# Patient Record
Sex: Female | Born: 1990 | Race: Black or African American | Hispanic: No | Marital: Single | Smoking: Never smoker
Health system: Southern US, Community
[De-identification: ages and names within clinical notes are randomized; demographics above are authoritative.]

## PROBLEM LIST (undated history)

## (undated) DIAGNOSIS — E669 Obesity, unspecified: Secondary | ICD-10-CM

---
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
## 2012-12-16 ENCOUNTER — Encounter (HOSPITAL_BASED_OUTPATIENT_CLINIC_OR_DEPARTMENT_OTHER): Payer: Self-pay | Admitting: Emergency Medicine

## 2012-12-16 ENCOUNTER — Emergency Department (HOSPITAL_BASED_OUTPATIENT_CLINIC_OR_DEPARTMENT_OTHER)
Admission: EM | Admit: 2012-12-16 | Discharge: 2012-12-17 | Disposition: A | Discharge status: Home / Self Care | Payer: Medicaid Other | Attending: Emergency Medicine | Admitting: Emergency Medicine

## 2012-12-16 DIAGNOSIS — M25572 Pain in left ankle and joints of left foot: Secondary | ICD-10-CM

## 2012-12-16 DIAGNOSIS — M25571 Pain in right ankle and joints of right foot: Secondary | ICD-10-CM

## 2012-12-16 DIAGNOSIS — M25579 Pain in unspecified ankle and joints of unspecified foot: Secondary | ICD-10-CM | POA: Insufficient documentation

## 2012-12-16 DIAGNOSIS — E669 Obesity, unspecified: Secondary | ICD-10-CM | POA: Insufficient documentation

## 2012-12-16 HISTORY — DX: Obesity, unspecified: E66.9

## 2012-12-16 NOTE — ED Notes (Signed)
Pt c/o ankle pain while walking at work last week. No specific injury

## 2012-12-17 MED ORDER — NAPROXEN 250 MG PO TABS
500.0000 mg | ORAL_TABLET | Freq: Once | ORAL | Status: AC
  Administered 2012-12-17: 500 mg via ORAL
  Filled 2012-12-17: qty 2

## 2012-12-17 MED ORDER — NAPROXEN SODIUM 550 MG PO TABS: ORAL_TABLET | ORAL | Status: DC

## 2012-12-17 NOTE — ED Provider Notes (Signed)
History     CSN: [id number]  Arrival date & time 12/16/12  2031   First MD Initiated Contact with Patient 12/17/12 0035      Chief Complaint  Patient presents with  . Ankle Pain    (Consider location/radiation/quality/duration/timing/severity/associated sxs/prior treatment) HPI This is a 22 year old female who is on her feet all day at work. She's had about a one-week history of pain in her ankles bilaterally. The pain is specifically located in the posterior and size of the ankles. She has been taking acetaminophen without relief. She denies trauma. There is no associated deformity or swelling. She denies systemic symptoms such as fever or chills. The pain is moderate and worse with palpation or movement  Past Medical History  Diagnosis Date  . Obesity     History reviewed. No pertinent past surgical history.  No family history on file.  History  Substance Use Topics  . Smoking status: Never Smoker   . Smokeless tobacco: Not on file  . Alcohol Use: No    OB History   Grav Para Term Preterm Abortions TAB SAB Ect Mult Living                  Review of Systems  All other systems reviewed and are negative.    Allergies  Review of patient's allergies indicates no known allergies.  Home Medications   Current Outpatient Rx  Name  Route  Sig  Dispense  Refill  . acetaminophen (TYLENOL) 325 MG tablet   Oral   Take 650 mg by mouth every 6 (six) hours as needed for pain.           BP 166/81  Pulse 76  Temp(Src) 98.2 F (36.8 C) (Oral)  Resp 18  Ht 5\' 9"  (1.753 m)  Wt 311 lb (141.069 kg)  BMI 45.91 kg/m2  SpO2 100%  Physical Exam General: Well-developed, well-nourished female in no acute distress; appearance consistent with age of record HENT: normocephalic, atraumatic Eyes: pupils equal round and reactive to light; extraocular muscles intact Neck: supple Heart: regular rate and rhythm Lungs: clear to auscultation bilaterally Abdomen: soft;  nondistended; nontender; bowel sounds present Extremities: No deformity; full range of motion; pulses normal; tenderness of soft tissue of ankles bilaterally without deformity, swelling or Achilles tendon defect palpated Neurologic: Awake, alert and oriented; motor function intact in all extremities and symmetric; no facial droop Skin: Warm and dry Psychiatric: Normal mood and affect    ED Course  Procedures (including critical care time)     MDM          Hanley Seamen, MD 12/17/12 0045

## 2014-08-09 ENCOUNTER — Emergency Department (HOSPITAL_BASED_OUTPATIENT_CLINIC_OR_DEPARTMENT_OTHER)
Admission: EM | Admit: 2014-08-09 | Discharge: 2014-08-09 | Disposition: A | Discharge status: Home / Self Care | Payer: PRIVATE HEALTH INSURANCE | Attending: Emergency Medicine | Admitting: Emergency Medicine

## 2014-08-09 ENCOUNTER — Encounter (HOSPITAL_BASED_OUTPATIENT_CLINIC_OR_DEPARTMENT_OTHER): Payer: Self-pay

## 2014-08-09 ENCOUNTER — Emergency Department (HOSPITAL_BASED_OUTPATIENT_CLINIC_OR_DEPARTMENT_OTHER): Payer: PRIVATE HEALTH INSURANCE

## 2014-08-09 DIAGNOSIS — E669 Obesity, unspecified: Secondary | ICD-10-CM | POA: Diagnosis not present

## 2014-08-09 DIAGNOSIS — R079 Chest pain, unspecified: Secondary | ICD-10-CM | POA: Diagnosis not present

## 2014-08-09 DIAGNOSIS — H7292 Unspecified perforation of tympanic membrane, left ear: Secondary | ICD-10-CM

## 2014-08-09 DIAGNOSIS — Z7952 Long term (current) use of systemic steroids: Secondary | ICD-10-CM | POA: Diagnosis not present

## 2014-08-09 DIAGNOSIS — R05 Cough: Secondary | ICD-10-CM | POA: Insufficient documentation

## 2014-08-09 DIAGNOSIS — H7202 Central perforation of tympanic membrane, left ear: Secondary | ICD-10-CM | POA: Diagnosis not present

## 2014-08-09 DIAGNOSIS — Z791 Long term (current) use of non-steroidal anti-inflammatories (NSAID): Secondary | ICD-10-CM | POA: Insufficient documentation

## 2014-08-09 DIAGNOSIS — R112 Nausea with vomiting, unspecified: Secondary | ICD-10-CM | POA: Diagnosis not present

## 2014-08-09 DIAGNOSIS — J029 Acute pharyngitis, unspecified: Secondary | ICD-10-CM | POA: Insufficient documentation

## 2014-08-09 DIAGNOSIS — Z79899 Other long term (current) drug therapy: Secondary | ICD-10-CM | POA: Insufficient documentation

## 2014-08-09 DIAGNOSIS — R059 Cough, unspecified: Secondary | ICD-10-CM

## 2014-08-09 DIAGNOSIS — R197 Diarrhea, unspecified: Secondary | ICD-10-CM | POA: Diagnosis not present

## 2014-08-09 MED ORDER — ONDANSETRON 4 MG PO TBDP: ORAL_TABLET | ORAL | Status: DC

## 2014-08-09 MED ORDER — DEXAMETHASONE 4 MG PO TABS: 12.0000 mg | ORAL_TABLET | Freq: Once | ORAL | Status: DC

## 2014-08-09 MED ORDER — AMOXICILLIN-POT CLAVULANATE 875-125 MG PO TABS: 1.0000 | ORAL_TABLET | Freq: Two times a day (BID) | ORAL | Status: DC

## 2014-08-09 NOTE — Discharge Instructions (Signed)
Eardrum Perforation The eardrum is a thin, round tissue inside the ear that separates the ear canal from the middle ear. This is the tissue that detects sound and enables you to hear. The eardrum can be punctured or torn (perforated). Eardrums generally heal without help and with little or no permanent hearing loss. CAUSES   Sudden pressure changes that happen in situations like scuba diving or flying in an airplane.  Foreign objects in the ear.  Inserting a cotton-tipped swab in the ear.  Loud noise.  Trauma to the ear. SYMPTOMS   Hearing loss.  Ear pain.  Ringing in the ears.  Discharge or bleeding from the ear.  Dizziness.  Vomiting.  Facial paralysis. HOME CARE INSTRUCTIONS   Keep your ear dry, as this improves healing. Swimming, diving, and showers are not allowed until healing is complete. While bathing, protect the ear by placing a piece of cotton covered with petroleum jelly in the outer ear canal.  Only take over-the-counter or prescription medicines for pain, discomfort, or fever as directed by your caregiver.  Blow your nose gently. Forceful blowing increases the pressure in the middle ear and may cause further injury or delay healing.  Resume normal activities, such as showering, when the perforation has healed. Your caregiver can let you know when this has occurred.  Talk to your caregiver before flying on an airplane. Air travel is generally allowed with a perforated eardrum.  If your caregiver has given you a follow-up appointment, it is very important to keep that appointment. Failure to keep the appointment could result in a chronic or permanent injury, pain, hearing loss, and disability. SEEK IMMEDIATE MEDICAL CARE IF:   You have bleeding or pus coming from your ear.  You have problems with balance, dizziness, nausea, or vomiting.  You develop increased pain.  You have a fever. MAKE SURE YOU:   Understand these instructions.  Will watch your  condition.  Will get help right away if you are not doing well or get worse. Document Released: 08/23/2000 Document Revised: 11/18/2011 Document Reviewed: 08/25/2008 [facility] Patient Information 2015 [place], [place]. This information is not intended to replace advice given to you by your health care provider. Make sure you discuss any questions you have with your health care provider.  Cough, Adult  A cough is a reflex that helps clear your throat and airways. It can help heal the body or may be a reaction to an irritated airway. A cough may only last 2 or 3 weeks (acute) or may last more than 8 weeks (chronic).  CAUSES Acute cough:  Viral or bacterial infections. Chronic cough:  Infections.  Allergies.  Asthma.  Post-nasal drip.  Smoking.  Heartburn or acid reflux.  Some medicines.  Chronic lung problems (COPD).  Cancer. SYMPTOMS   Cough.  Fever.  Chest pain.  Increased breathing rate.  High-pitched whistling sound when breathing (wheezing).  Colored mucus that you cough up (sputum). TREATMENT   A bacterial cough may be treated with antibiotic medicine.  A viral cough must run its course and will not respond to antibiotics.  Your caregiver may recommend other treatments if you have a chronic cough. HOME CARE INSTRUCTIONS   Only take over-the-counter or prescription medicines for pain, discomfort, or fever as directed by your caregiver. Use cough suppressants only as directed by your caregiver.  Use a cold steam vaporizer or humidifier in your bedroom or home to help loosen secretions.  Sleep in a semi-upright position if your cough is  worse at night.  Rest as needed.  Stop smoking if you smoke. SEEK IMMEDIATE MEDICAL CARE IF:   You have pus in your sputum.  Your cough starts to worsen.  You cannot control your cough with suppressants and are losing sleep.  You begin coughing up blood.  You have difficulty breathing.  You develop pain which is  getting worse or is uncontrolled with medicine.  You have a fever. MAKE SURE YOU:   Understand these instructions.  Will watch your condition.  Will get help right away if you are not doing well or get worse. Document Released: 02/22/2011 Document Revised: 11/18/2011 Document Reviewed: 02/22/2011 [facility] Patient Information 2015 [place], [place]. This information is not intended to replace advice given to you by your health care provider. Make sure you discuss any questions you have with your health care provider.   Emergency Department Resource Guide 1) Find a Doctor and Pay Out of Pocket Although you won't have to find out who is covered by your insurance plan, it is a good idea to ask around and get recommendations. You will then need to call the office and see if the doctor you have chosen will accept you as a new patient and what types of options they offer for patients who are self-pay. Some doctors offer discounts or will set up payment plans for their patients who do not have insurance, but you will need to ask so you aren't surprised when you get to your appointment.  2) Contact Your Local Health Department Not all health departments have doctors that can see patients for sick visits, but many do, so it is worth a call to see if yours does. If you don't know where your local health department is, you can check in your phone book. The CDC also has a tool to help you locate your state's health department, and many state websites also have listings of all of their local health departments.  3) Find a Walk-in Clinic If your illness is not likely to be very severe or complicated, you may want to try a walk in clinic. These are popping up all over the country in pharmacies, drugstores, and shopping centers. They're usually staffed by nurse practitioners or physician assistants that have been trained to treat common illnesses and complaints. They're usually fairly quick and inexpensive.  However, if you have serious medical issues or chronic medical problems, these are probably not your best option.  No Primary Care Doctor: - Call Health Connect at  [phone] - they can help you locate a primary care doctor that  accepts your insurance, provides certain services, etc. - Physician Referral Service- [phone]  Chronic Pain Problems: Organization         Address  Phone   Notes  [facility]  [phone] Patients need to be referred by their primary care doctor.   Medication Assistance: Organization         Address  Phone   Notes  [facility] Medication [facility] [address] [place] [place], [place] [address] [phone] --Must be a resident of [facility] -- Must have NO insurance coverage whatsoever (no Medicaid/ Medicare, etc.) -- The pt. MUST have a primary care doctor that directs their care regularly and follows them in the community   MedAssist  [phone]   [employer]  [phone]    Agencies that provide inexpensive medical care: Organization         Address  Phone   Notes  [facility]  [phone]   [facility]    [phone]   [facility] [address] [place], [place] [address] [phone]   [facility] [place] [address], [place] [phone]   [facility]anned Parenthood    [phone]   [facility]    [phone]   [facility] and [facility]  [address], [place] Phone:  [phone], Fax:  [phone] Hours of Operation:  9 am - 6 pm, M-F.  Also accepts Medicaid/Medicare and self-pay.  [facility]  [address], [place] Phone: [phone](336) [phone], Fax: [phone]. Hours of Operation:  8:30 am - 5:30 pm, M-F.  Also accepts Medicaid and self-pay.  [facility] [place] [address], [place] Phone: [phone](336) [phone]   [facility] [address], [place], [place] [phone], Ext. [phone] Mondays & Thursdays: 7-9 AM.  First 15 patients are seen on a first come, first serve basis.    Medicaid-accepting [facility] Providers:  Organization         Address  Phone   Notes  [facility] [address], [place] [phone]) [phone] Also accepts self-pay patients.  [facility] [address] [place], [place]  [phone]) [phone]   [facility] [address], [place] [phone]   [facility] [address] [place] [address], [place] [phone]   Renaye RakersVeita Bland [address], [place]   [phone] Only accepts [place] Access [place] patients after they have their name applied to their card.   Self-Pay (no insurance) in [facility]:  Organization         Address  Phone   Notes  Sickle Cell Patients, [facility] [address], [place] [phone]) [phone]   [facility] [address] [place] [address], [place] [phone]   [facility] [place]  [address], [place] [phone]) [phone]   [facility]/Dr. Osei-Bonsu  [address] [place] [address], [place] or [address], [place] [phone]) [phone] Phone number for both [place] and [place] locations is the same.  Urgent Medical and [facility] [address], [place] [phone]   [facility] [place] [address] [place] [address], [place] or [address] [phone]   [facility] [address], [place] [phone], phone; [phone], fax Sees patients 1st and 3rd Saturday of every month.  Must not qualify for public or private insurance (i.e. Medicaid, Medicare, [place] Health Choice, Veterans' Benefits)  Household income should be no more than 200% of the poverty level The clinic cannot treat you if you are pregnant or think  you are pregnant  Sexually transmitted diseases are not treated at the clinic.    Dental Care: Organization         Address  Phone  Notes  [facility] Department of [facility] [address], [place] [phone] Accepts children up to age 22 who are enrolled in [place]icaid or [place] Health Choice; pregnant women with a Medicaid card; and children who have applied for Medicaid or [place] Health Choice, but were declined, whose parents can pay a reduced fee at time of service.  [facility] of [facility] [place]  [address], [place] [phone]) [phone] Accepts children up to age 22 who are enrolled in [place]icaid or [place] Health Choice; pregnant women with a Medicaid card; and children who have applied for Medicaid or [place] Health Choice, but were declined, whose parents can pay a reduced fee at time of service.  [facility]  [address], [place] [phone]) [phone] Patients are seen by appointment only. Walk-ins are not accepted. [facility] will see patients 22 years of age and older. Monday - Tuesday (8am-5pm) Most Wednesdays (8:30-5pm) $30 per visit, cash only  [facility]  [address], [facility] [phone]) [phone] Patients are seen by appointment only. Walk-ins are not accepted. [facility] will see patients 22 years of age and older. One Wednesday Evening (Monthly: Volunteer Based).  $30 per visit, cash only  [facility]  [phone] for adults; Children under age 454, call Graduate Pediatric Dentistry at [phone]. Children aged 134-14, please call [phone] to request a pediatric application.  Dental services are provided in all areas of dental care including fillings, crowns and bridges, complete and partial dentures, implants, gum treatment, root canals, and extractions. Preventive care is also provided. Treatment is provided to both adults and  children. Patients are selected via a lottery and there is often a waiting list.   [facility] [address], [place]  [phone] [email or web address]   [facility] [address], [place], [place] [phone], Ext. [phone] Second and Fourth Thursday of each month, opens at 6:30 AM; Clinic ends at 9 AM.  Patients are seen on a first-come first-served basis, and a limited number are seen during each clinic.   [facility]  [address], [place], [place] [phone]) [phone]   Eligibility Requirements You must have lived in [place], [place], or [place] counties for at least the last three months.   You cannot be eligible for state or federal sponsored National Cityhealthcare insurance, including CIGNAVeterans Administration, [place]icaid, or [employer]Medicare.   You generally cannot be eligible for healthcare insurance through your employer.    How to apply: Eligibility screenings are held every Tuesday and Wednesday afternoon from 1:00 pm until 4:00 pm. You do not need an appointment for the interview!  [facility] [address], [place], [place] [phone]   [facility]  [phone]   [facility]  [phone]   [facility]  [phone]    Behavioral Health Resources in the Community: Intensive Outpatient Programs Organization         Address  Phone  Notes  [facility] [address], [place], [place] [phone]   [facility] [address], [place], [place] [phone]   [facility] [address], [place], [place]  [phone]   [facility] [address],  [place], [place] [phone] or [phone]   Substance Abuse Resources Organization         Address  Phone  Notes  [facility] Drug Services  [phone]   [facility]  [phone]   [facility]  [phone]     [facility]  [phone]   Residential & Outpatient Substance Abuse Program  [phone]   Psychological Services Organization         Address  Phone  Notes  [facility]  [phone]   [facility] Services  [phone]   [facility] [address] [place] [phone] or [phone]    Mobile Crisis Teams Organization         Address  Phone  Notes  [facility], Mobile Crisis Care Unit  [phone]   [facility]  [address] [place], [place] [phone]   Doristine Locks [address] [place] [phone]    Self-Help/Support Groups Organization         Address  Phone             Notes  [facility] [place] - variety of support groups  336- I7437963 Call for more information  Narcotics Anonymous (NA), [facility]aring [facility] [address], [employer] [place]  2 meetings at this location   Statistician         Address  Phone  Notes  [facility] [address],    [place]  [phone]   [facility]  [address] [place] [address], [place], [place] [phone]   [facility] [address] [place], [place] [phone] Admissions: 8am-3pm M-F  [facility] [address]    [place], [place] [phone]   [facility] [address] [place], [place], [place] [phone]   [facility] [address],  [place], [place] [phone]   [facility] - Intensive Outpatient [address], [place], [place] [phone]   [facility] ([facility] [address] [place]  [place], [place] [phone] or [phone]   [facility] ([facility]) [address], [place], [place] [phone] Accepts Medicaid  [facility] [place] [address],  [place] [phone] Substance Abuse/Addiction Treatment   [facility] Organization         Address  Phone  Notes  [facility]  [phone]   Angie Fava, PhD [address] [place], [place]   [phone] or [phone]   [facility] Behavioral   [address] [place], [place] [phone]   [facility] [address], [place], [place] [phone] Insurance/Medicaid/sponsorship through [facility] and Families [address]                                    [place], [place] [phone] Therapy/tele-psych/case  [facility] [address], [place] [phone]    Dr. Lolly Mustache  [phone]   [facility] [place]  [employer] [facility] 1) [address] [place] 2) [address], [place] 3)  [address], [place] [phone]  [phone]   [facility] Child Abuse Hotline [phone])654-6986 or [phone] (After Hours)

## 2014-08-09 NOTE — ED Notes (Signed)
Pt c/o nonproductive cough x2wks with fever

## 2014-08-09 NOTE — ED Provider Notes (Signed)
CSN: [id number]     Arrival date & time 08/09/14  2159 History  This chart was scribed for Mirian MoMatthew Gentry, MD by Gwenyth Oberatherine Macek, ED Scribe. This patient was seen in room MH05/MH05 and the patient's care was started at 10:53 PM.    Chief Complaint  Patient presents with  . Cough   The history is provided by the patient. No language interpreter was used.    HPI Comments: Kristie Zhang is a 23 y.o. female who presents to the Emergency Department complaining of intermittent, nonproductive cough that started 2 weeks ago. She states SOB, a fever of 101.2 that occurred yesterday, diarrhea, nausea, sore throat, chest pain with deep breaths and coughing, left ear pain and 1 episode of vomiting as associated symptoms. Pt came in today because she was walking up the stairs and could not catch her breath. She states that cough began 2 weeks ago, gradually became better and then became worse again. She denies taking oral contraceptives, hormone therapy, smoking, recent surgeries and no history of PE/DVT. She also denies abdominal pain and dysuria as associated symptoms.    Past Medical History  Diagnosis Date  . Obesity    History reviewed. No pertinent past surgical history. No family history on file. History  Substance Use Topics  . Smoking status: Never Smoker   . Smokeless tobacco: Not on file  . Alcohol Use: No   OB History    No data available     Review of Systems  Constitutional: Positive for fever.  HENT: Positive for ear pain and sore throat.   Respiratory: Positive for cough and shortness of breath.   Cardiovascular: Positive for chest pain.  Gastrointestinal: Positive for nausea, vomiting and diarrhea. Negative for abdominal pain.  Genitourinary: Negative for dysuria.  All other systems reviewed and are negative.  Allergies  Review of patient's allergies indicates no known allergies.  Home Medications   Prior to Admission medications   Medication Sig Start Date End Date  Taking? Authorizing Provider  acetaminophen (TYLENOL) 325 MG tablet Take 650 mg by mouth every 6 (six) hours as needed for pain.    Historical Provider, MD  amoxicillin-clavulanate (AUGMENTIN) 875-125 MG per tablet Take 1 tablet by mouth every 12 (twelve) hours. 08/09/14   Mirian MoMatthew Gentry, MD  dexamethasone (DECADRON) 4 MG tablet Take 3 tablets (12 mg total) by mouth once. 08/09/14   Mirian MoMatthew Gentry, MD  naproxen sodium (ANAPROX DS) 550 MG tablet Take one tablet twice daily as needed for ankle pain. This taken with a meal. 12/17/12   Carlisle BeersJohn L Molpus, MD  ondansetron (ZOFRAN ODT) 4 MG disintegrating tablet 4mg  ODT q4 hours prn nausea/vomit 08/09/14   Mirian MoMatthew Gentry, MD   BP 132/67 mmHg  Pulse 83  Temp(Src) 99.7 F (37.6 C) (Oral)  Resp 24  SpO2 100%  LMP 08/07/2014 Physical Exam  Constitutional: She is oriented to person, place, and time. She appears well-developed and well-nourished.  HENT:  Head: Normocephalic and atraumatic.  Right Ear: Tympanic membrane and external ear normal.  Left Ear: External ear normal. Tympanic membrane is perforated.  Eyes: Conjunctivae and EOM are normal. Pupils are equal, round, and reactive to light.  Neck: Normal range of motion. Neck supple.  Cardiovascular: Normal rate, regular rhythm, normal heart sounds and intact distal pulses.   Pulmonary/Chest: Effort normal and breath sounds normal.  Abdominal: Soft. Bowel sounds are normal. There is no tenderness.  Musculoskeletal: Normal range of motion.  Neurological: She is alert and oriented to  person, place, and time.  Skin: Skin is warm and dry.  Vitals reviewed.   ED Course  Procedures (including critical care time) DIAGNOSTIC STUDIES: Oxygen Saturation is 99% on RA, normal by my interpretation.    COORDINATION OF CARE: 10:58 PM Discussed chest x-ray results with pt. Discussed treatment plan with pt at bedside and pt agreed to plan.  Labs Review Labs Reviewed - No data to display  Imaging Review Dg  Chest 2 View  08/09/2014   CLINICAL DATA:  23 year old female with shortness of breath, chest pain and cough. Initial encounter.  EXAM: CHEST  2 VIEW  COMPARISON:  10/16/2005 and 01/10/2010  FINDINGS: The cardiomediastinal silhouette is unremarkable.  Mild left basilar scarring again noted.  There is no evidence of airspace disease pulmonary edema, suspicious pulmonary nodule/mass, pleural effusion, or pneumothorax. No acute bony abnormalities are identified.  IMPRESSION: No evidence of active cardiopulmonary disease.   Electronically Signed   By: Laveda AbbeJeff  Hu M.D.   On: 08/09/2014 22:46     EKG Interpretation None      MDM   Final diagnoses:  Cough  Perforated ear drum, left    23 y.o. female with pertinent PMH of obesity presents with URI symptoms as above. Patient has had cough over same time. On arrival today vitals signs and physical exam as above. Patient has perforated left tympanic membrane.  Chest x-ray unremarkable. Discharged home with Augmentin and ENT follow-up..    1. Cough   2. Perforated ear drum, left          Mirian MoMatthew Gentry, MD 08/10/14 1640

## 2015-01-21 ENCOUNTER — Emergency Department (HOSPITAL_BASED_OUTPATIENT_CLINIC_OR_DEPARTMENT_OTHER)
Admission: EM | Admit: 2015-01-21 | Discharge: 2015-01-21 | Disposition: A | Discharge status: Home / Self Care | Payer: PRIVATE HEALTH INSURANCE | Attending: Emergency Medicine | Admitting: Emergency Medicine

## 2015-01-21 ENCOUNTER — Encounter (HOSPITAL_BASED_OUTPATIENT_CLINIC_OR_DEPARTMENT_OTHER): Payer: Self-pay

## 2015-01-21 DIAGNOSIS — M7918 Myalgia, other site: Secondary | ICD-10-CM

## 2015-01-21 DIAGNOSIS — E669 Obesity, unspecified: Secondary | ICD-10-CM | POA: Insufficient documentation

## 2015-01-21 DIAGNOSIS — S4990XA Unspecified injury of shoulder and upper arm, unspecified arm, initial encounter: Secondary | ICD-10-CM | POA: Diagnosis not present

## 2015-01-21 DIAGNOSIS — Y9389 Activity, other specified: Secondary | ICD-10-CM | POA: Diagnosis not present

## 2015-01-21 DIAGNOSIS — S199XXA Unspecified injury of neck, initial encounter: Secondary | ICD-10-CM | POA: Insufficient documentation

## 2015-01-21 DIAGNOSIS — S8991XA Unspecified injury of right lower leg, initial encounter: Secondary | ICD-10-CM | POA: Diagnosis not present

## 2015-01-21 DIAGNOSIS — Y92410 Unspecified street and highway as the place of occurrence of the external cause: Secondary | ICD-10-CM | POA: Insufficient documentation

## 2015-01-21 DIAGNOSIS — Y998 Other external cause status: Secondary | ICD-10-CM | POA: Insufficient documentation

## 2015-01-21 MED ORDER — KETOROLAC TROMETHAMINE 60 MG/2ML IM SOLN
60.0000 mg | Freq: Once | INTRAMUSCULAR | Status: AC
  Administered 2015-01-21: 60 mg via INTRAMUSCULAR
  Filled 2015-01-21: qty 2

## 2015-01-21 MED ORDER — IBUPROFEN 800 MG PO TABS: 800.0000 mg | ORAL_TABLET | Freq: Three times a day (TID) | ORAL | Status: AC

## 2015-01-21 MED ORDER — DIAZEPAM 5 MG PO TABS
5.0000 mg | ORAL_TABLET | Freq: Four times a day (QID) | ORAL | Status: DC | PRN
  Administered 2015-01-21: 5 mg via ORAL
  Filled 2015-01-21: qty 1
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
## 2015-06-18 IMAGING — CR DG CHEST 2V
2 series · 2 of 2 positions shown · non-contrast
Comparison: 10/16/2005 and 01/10/2010

CLINICAL DATA: 23-year-old female with shortness of breath, chest
pain and cough. Initial encounter.

EXAM:
CHEST  2 VIEW

[w chest pa]
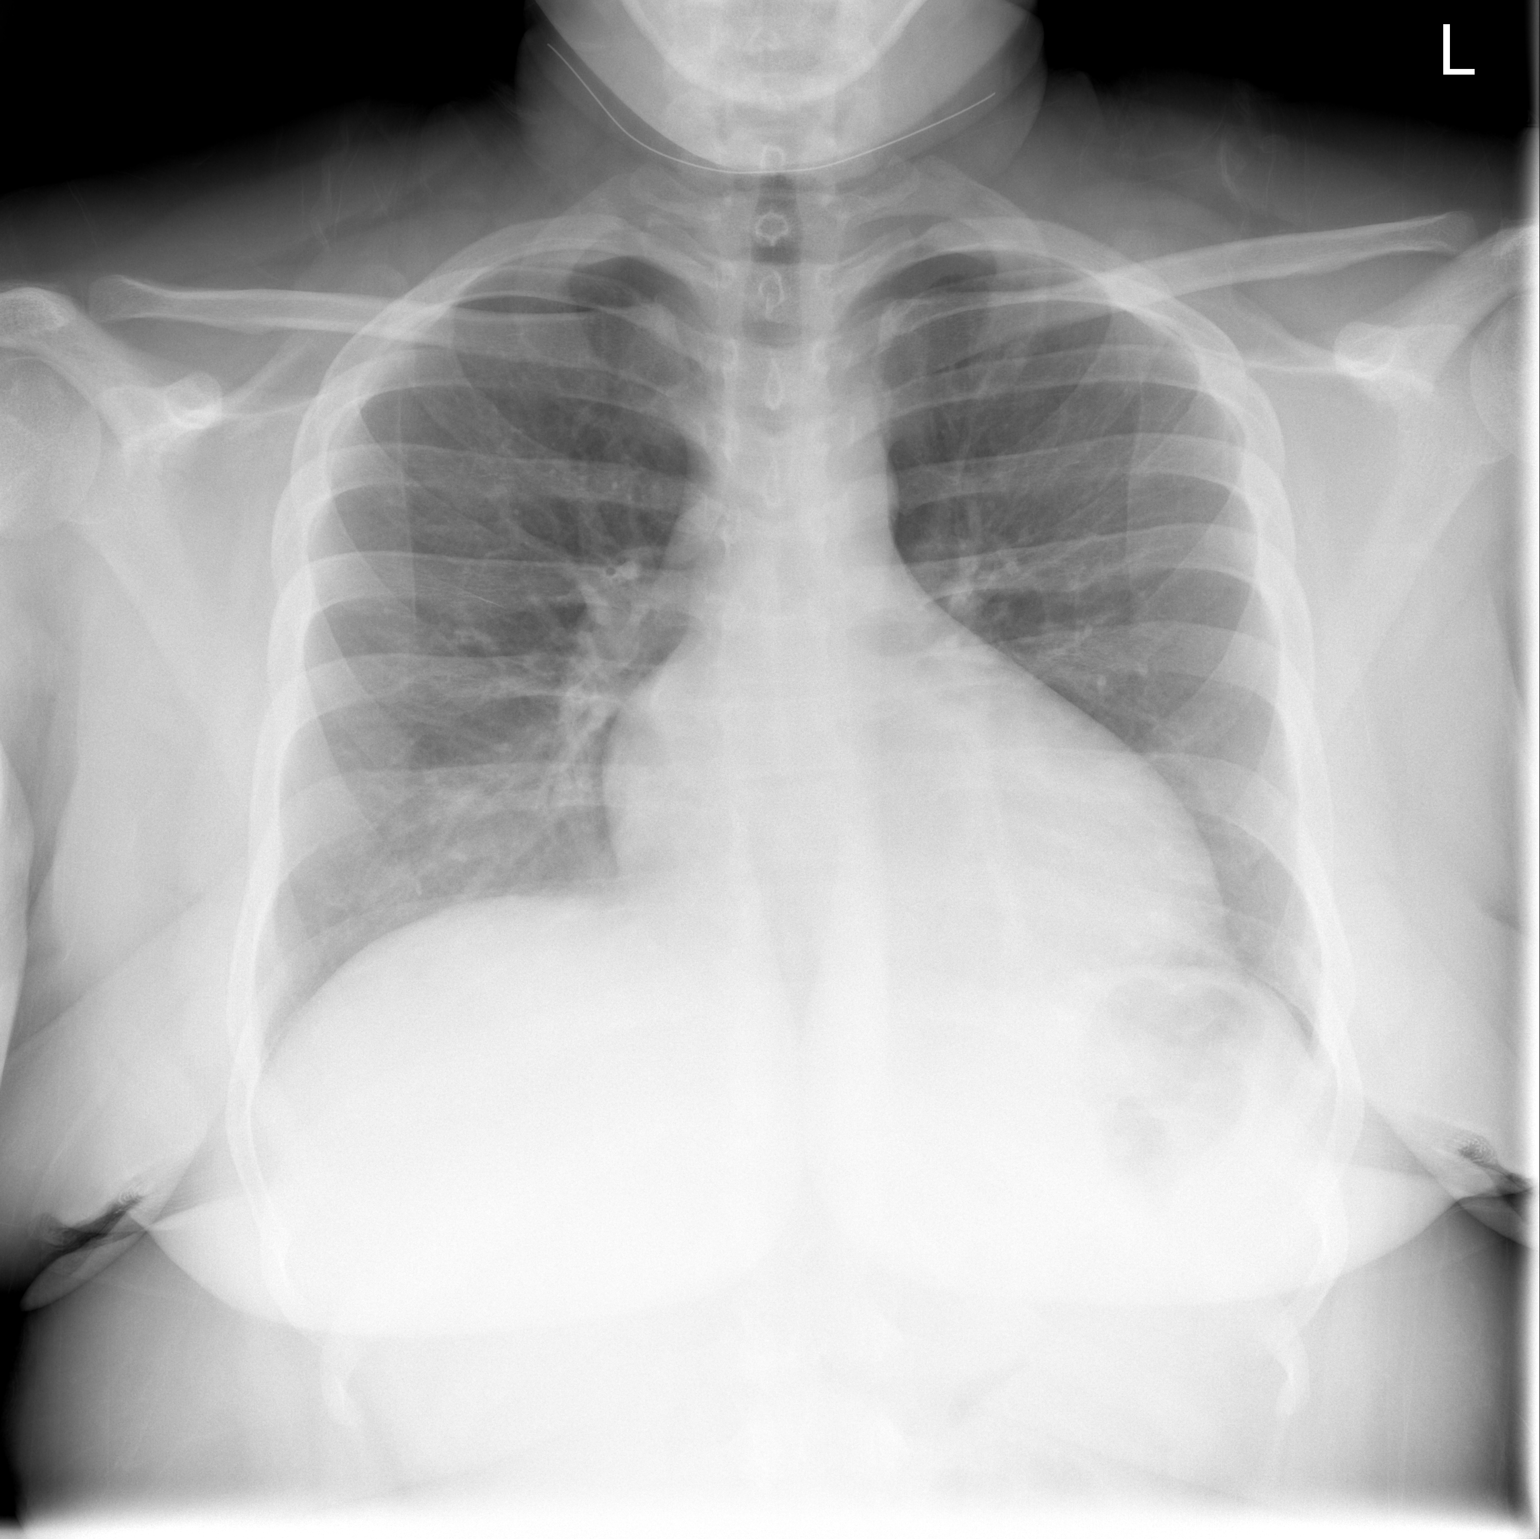

[w chest lat]
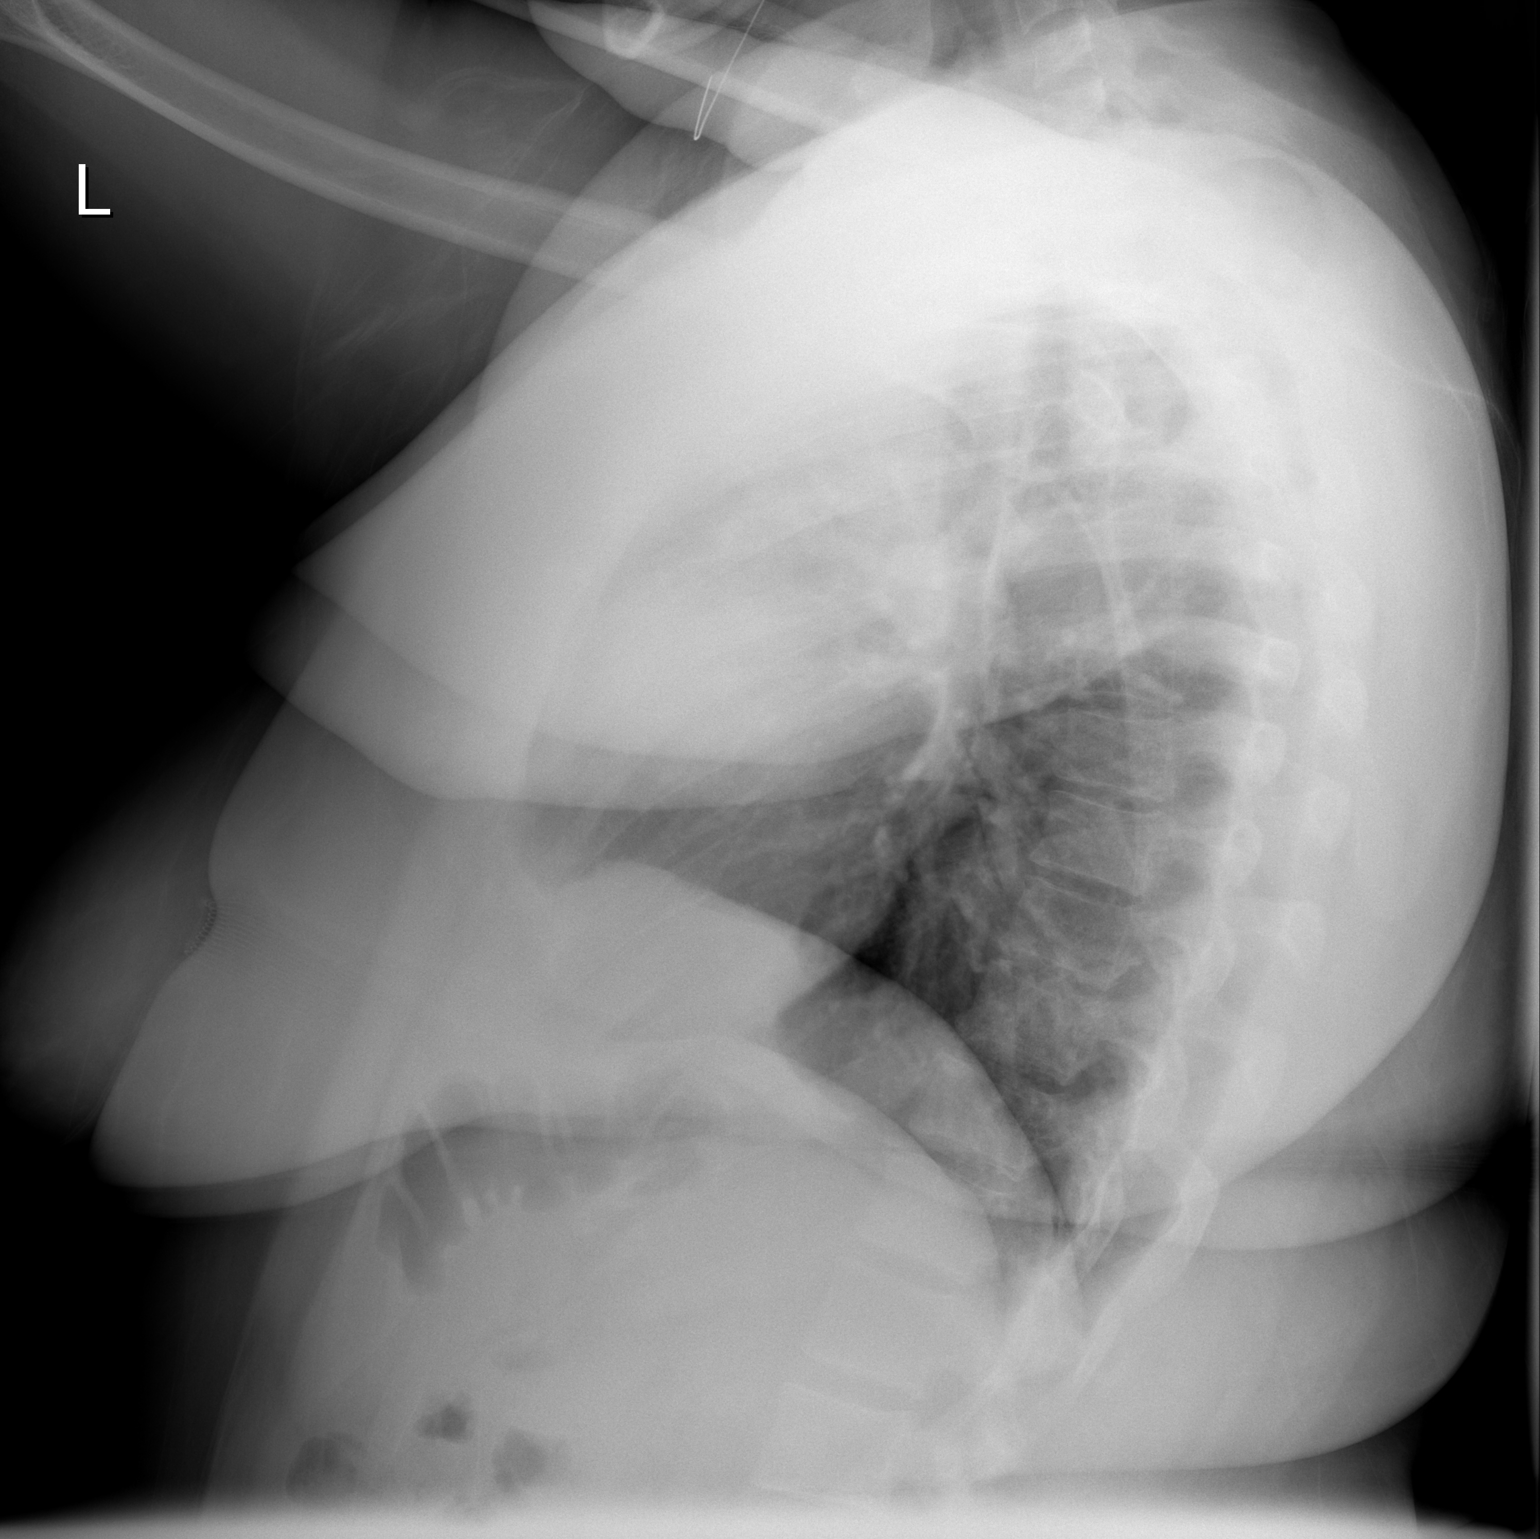

[2 of 2 positions shown; findings below may reference images not displayed]

FINDINGS: The cardiomediastinal silhouette is unremarkable.

Mild left basilar scarring again noted.

There is no evidence of airspace disease pulmonary edema, suspicious
pulmonary nodule/mass, pleural effusion, or pneumothorax. No acute
bony abnormalities are identified.
IMPRESSION: No evidence of active cardiopulmonary disease.

## 2016-02-12 ENCOUNTER — Encounter (HOSPITAL_BASED_OUTPATIENT_CLINIC_OR_DEPARTMENT_OTHER): Payer: Self-pay | Admitting: Emergency Medicine

## 2016-02-12 ENCOUNTER — Emergency Department (HOSPITAL_BASED_OUTPATIENT_CLINIC_OR_DEPARTMENT_OTHER)
Admission: EM | Admit: 2016-02-12 | Discharge: 2016-02-12 | Disposition: A | Discharge status: Home / Self Care | Payer: Medicaid Other | Attending: Emergency Medicine | Admitting: Emergency Medicine

## 2016-02-12 DIAGNOSIS — E669 Obesity, unspecified: Secondary | ICD-10-CM | POA: Insufficient documentation

## 2016-02-12 DIAGNOSIS — J029 Acute pharyngitis, unspecified: Secondary | ICD-10-CM | POA: Insufficient documentation

## 2016-02-12 DIAGNOSIS — Z6841 Body Mass Index (BMI) 40.0 and over, adult: Secondary | ICD-10-CM | POA: Diagnosis not present

## 2016-02-12 DIAGNOSIS — R599 Enlarged lymph nodes, unspecified: Secondary | ICD-10-CM | POA: Diagnosis not present

## 2016-02-12 LAB — RAPID STREP SCREEN (MED CTR MEBANE ONLY): STREPTOCOCCUS, GROUP A SCREEN (DIRECT): NEGATIVE

## 2016-02-12 MED ORDER — AMOXICILLIN 500 MG PO CAPS: 1000.0000 mg | ORAL_CAPSULE | Freq: Every day | ORAL | Status: AC

## 2016-02-12 MED FILL — AMOXICILLIN 500 MG CAPSULE: 500 | 10 days supply | Qty: 20 | Fill #0

## 2016-02-12 NOTE — Discharge Instructions (Signed)
Please take all of your antibiotics until finished!  Throw away your toothbrush and begin using a new one in three days. Return to ER for new or worsening symptoms, any additional concerns.

## 2016-02-12 NOTE — ED Notes (Signed)
Patient has sore throat. Boyfriend has strep throat

## 2016-02-12 NOTE — ED Provider Notes (Signed)
CSN: [id number]     Arrival date & time 02/12/16  1527 History   First MD Initiated Contact with Patient 02/12/16 1541     Chief Complaint  Patient presents with  . Sore Throat    (Consider location/radiation/quality/duration/timing/severity/associated sxs/prior Treatment) Patient is a 25 y.o. female presenting with pharyngitis.  Sore Throat Associated symptoms include congestion, coughing and a sore throat. Pertinent negatives include no chest pain or fever.    ATLEY SCARBORO is a 25 y.o. female  with no pertinent PMH who presents to the Emergency Department complaining of worsening sore throat described as scratchy x 2 days. Boyfriend diagnosed with strep 3 days ago. Cousin in ED at present with sore throat as well. Admits to associated dry cough and nasal congestion. Denies sob, chest pain, n/v, abdominal pain, fever. No medications taken prior to arrival for symptoms. No alleviating or aggravating factors noted.   Past Medical History  Diagnosis Date  . Obesity    History reviewed. No pertinent past surgical history. History reviewed. No pertinent family history. Social History  Substance Use Topics  . Smoking status: Never Smoker   . Smokeless tobacco: None  . Alcohol Use: No   OB History    No data available     Review of Systems  Constitutional: Negative for fever.  HENT: Positive for congestion and sore throat.   Respiratory: Positive for cough. Negative for shortness of breath.   Cardiovascular: Negative for chest pain.      Allergies  Review of patient's allergies indicates no known allergies.  Home Medications   Prior to Admission medications   Medication Sig Start Date End Date Taking? Authorizing Provider  acetaminophen (TYLENOL) 325 MG tablet Take 650 mg by mouth every 6 (six) hours as needed for pain.    Historical Provider, MD  amoxicillin (AMOXIL) 500 MG capsule Take 2 capsules (1,000 mg total) by mouth daily. 02/12/16   Chase Picket Ward, PA-C   ibuprofen (ADVIL,MOTRIN) 800 MG tablet Take 1 tablet (800 mg total) by mouth 3 (three) times daily. 01/21/15   Benjamin Cartner, PA-C   BP 139/97 mmHg  Pulse 78  Temp(Src) 98.6 F (37 C) (Oral)  Resp 78  Ht  (1.753 m)  Wt 136.079 kg  BMI 44.28 kg/m2  SpO2 100%  LMP 02/01/2016 Physical Exam  Constitutional: She is oriented to person, place, and time. She appears well-developed and well-nourished. No distress.  HENT:  Head: Normocephalic and atraumatic.  OP with erythema, no exudates or tonsillar hypertrophy. + nasal congestion with mucosal edema.   Neck: Normal range of motion. Neck supple.  Cardiovascular: Normal rate, regular rhythm and normal heart sounds.   Pulmonary/Chest: Effort normal.  Lungs are clear to auscultation bilaterally - no w/r/r  Abdominal: Soft. She exhibits no distension. There is no tenderness.  Musculoskeletal: Normal range of motion.  Lymphadenopathy:    She has cervical adenopathy (Right anterior).  Neurological: She is alert and oriented to person, place, and time.  Skin: Skin is warm and dry. She is not diaphoretic.  Nursing note and vitals reviewed.   ED Course  Procedures (including critical care time) Labs Review Labs Reviewed  RAPID STREP SCREEN (NOT AT [facility])  CULTURE, GROUP A STREP [facility])    Imaging Review No results found. I have personally reviewed and evaluated these images and lab results as part of my medical decision-making.   EKG Interpretation None      MDM   Final diagnoses:  Pharyngitis  Bernita Raisinanaysia M Fierro presents to ED for sore throat. Boyfriend tested positive for strep 3 days ago. Cousin in ED today and tested positive. Given close contacts with multiple strep + patients, will treat with amoxil. Home care instructions given. PCP follow-up if no improvement. Return precautions given and all questions answered.  [facility] Laser PLLCJaime Pilcher Ward, PA-C 02/12/16 1638  Marily MemosJason Mesner, MD 02/13/16 [phone]

## 2016-02-14 LAB — CULTURE, GROUP A STREP (THRC)
